# Patient Record
Sex: Male | Born: 2000 | Race: Black or African American | Hispanic: No | Marital: Single | Smoking: Current every day smoker
Health system: Southern US, Community
[De-identification: ages and names within clinical notes are randomized; demographics above are authoritative.]

---
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
## 2020-05-05 ENCOUNTER — Other Ambulatory Visit: Payer: Self-pay

## 2020-05-05 ENCOUNTER — Encounter (HOSPITAL_BASED_OUTPATIENT_CLINIC_OR_DEPARTMENT_OTHER): Payer: Self-pay | Admitting: *Deleted

## 2020-05-05 DIAGNOSIS — F17200 Nicotine dependence, unspecified, uncomplicated: Secondary | ICD-10-CM | POA: Insufficient documentation

## 2020-05-05 DIAGNOSIS — U071 COVID-19: Secondary | ICD-10-CM | POA: Diagnosis not present

## 2020-05-05 DIAGNOSIS — J069 Acute upper respiratory infection, unspecified: Secondary | ICD-10-CM | POA: Diagnosis present

## 2020-05-05 NOTE — ED Triage Notes (Signed)
C/o covid sx x 3 days 

## 2020-05-06 ENCOUNTER — Emergency Department (HOSPITAL_BASED_OUTPATIENT_CLINIC_OR_DEPARTMENT_OTHER)
Admission: EM | Admit: 2020-05-06 | Discharge: 2020-05-06 | Disposition: A | Discharge status: Home / Self Care | Payer: HRSA Program | Attending: Emergency Medicine | Admitting: Emergency Medicine

## 2020-05-06 DIAGNOSIS — J069 Acute upper respiratory infection, unspecified: Secondary | ICD-10-CM

## 2020-05-06 LAB — SARS CORONAVIRUS 2 (TAT 6-24 HRS): SARS Coronavirus 2: POSITIVE — AB

## 2020-05-06 NOTE — ED Provider Notes (Signed)
[facility] EMERGENCY DEPARTMENT Provider Note   CSN: [id number] Arrival date & time: 05/05/20  2120     History Chief Complaint  Patient presents with  . covid sx    Dalton Chandler is a 20 y.o. male.  The history is provided by the patient.  URI Presenting symptoms: congestion, cough, rhinorrhea and sore throat   Severity:  Mild Onset quality:  Gradual Duration:  2 days Timing:  Constant Progression:  Worsening Chronicity:  New Relieved by:  None tried Worsened by:  Nothing Ineffective treatments:  None tried Associated symptoms: myalgias and sinus pain        No past medical history on file.  There are no problems to display for this patient.   History reviewed. No pertinent surgical history.     No family history on file.  Social History   Tobacco Use  . Smoking status: Current Every Day Smoker  . Smokeless tobacco: Never Used  Substance Use Topics  . Alcohol use: No  . Drug use: Yes    Types: Marijuana    Home Medications Prior to Admission medications   Medication Sig Start Date End Date Taking? Authorizing Provider  ibuprofen (ADVIL,MOTRIN) 600 MG tablet Take 1 tablet (600 mg total) by mouth every 6 (six) hours as needed for pain. 09/18/12   Loren Racer, MD    Allergies    Patient has no known allergies.  Review of Systems   Review of Systems  HENT: Positive for congestion, rhinorrhea, sinus pain and sore throat.   Respiratory: Positive for cough.   Musculoskeletal: Positive for myalgias.  All other systems reviewed and are negative.   Physical Exam Updated Vital Signs BP 140/76 (BP Location: Right Arm)   Pulse 72   Resp 17   Ht 5\' 7"  (1.702 m)   Wt 90.7 kg   SpO2 99%   BMI 31.32 kg/m   Physical Exam Vitals and nursing note reviewed.  Constitutional:      Appearance: He is well-developed and well-nourished.  HENT:     Head: Normocephalic and atraumatic.     Nose: Nose normal. No congestion or rhinorrhea.      Mouth/Throat:     Mouth: Mucous membranes are moist.     Pharynx: Oropharynx is clear.  Eyes:     Pupils: Pupils are equal, round, and reactive to light.  Cardiovascular:     Rate and Rhythm: Normal rate.  Pulmonary:     Effort: Pulmonary effort is normal. No respiratory distress.  Abdominal:     General: Abdomen is flat. There is no distension.  Musculoskeletal:        General: Normal range of motion.     Cervical back: Normal range of motion.  Skin:    General: Skin is warm and dry.     Coloration: Skin is not jaundiced or pale.  Neurological:     General: No focal deficit present.     Mental Status: He is alert.     ED Results / Procedures / Treatments   Labs (all labs ordered are listed, but only abnormal results are displayed) Labs Reviewed  SARS CORONAVIRUS 2 (TAT 6-24 HRS)    EKG None  Radiology No results found.  Procedures Procedures (including critical care time)  Medications Ordered in ED Medications - No data to display  ED Course  I have reviewed the triage vital signs and the nursing notes.  Pertinent labs & imaging results that were available during my care of  the patient were reviewed by me and considered in my medical decision making (see chart for details).    MDM Rules/Calculators/A&P                          covid type symptoms. Will test for same. VS WNL, exam unremarkable, no indication for further workup in ED, will dc pending test results.   Final Clinical Impression(s) / ED Diagnoses Final diagnoses:  Upper respiratory tract infection, unspecified type    Rx / DC Orders ED Discharge Orders    None       Maebell Lyvers, Barbara Cower, MD 05/06/20 [phone]

## 2021-05-08 ENCOUNTER — Emergency Department (HOSPITAL_BASED_OUTPATIENT_CLINIC_OR_DEPARTMENT_OTHER)
Admission: EM | Admit: 2021-05-08 | Discharge: 2021-05-08 | Disposition: A | Discharge status: Home / Self Care | Payer: Self-pay | Attending: Emergency Medicine | Admitting: Emergency Medicine

## 2021-05-08 ENCOUNTER — Other Ambulatory Visit: Payer: Self-pay

## 2021-05-08 ENCOUNTER — Encounter (HOSPITAL_BASED_OUTPATIENT_CLINIC_OR_DEPARTMENT_OTHER): Payer: Self-pay | Admitting: Emergency Medicine

## 2021-05-08 DIAGNOSIS — J029 Acute pharyngitis, unspecified: Secondary | ICD-10-CM | POA: Insufficient documentation

## 2021-05-08 DIAGNOSIS — Z720 Tobacco use: Secondary | ICD-10-CM | POA: Insufficient documentation

## 2021-05-08 LAB — GROUP A STREP BY PCR: Group A Strep by PCR: NOT DETECTED

## 2021-05-08 NOTE — Discharge Instructions (Signed)
Please quit smoking cigarettes, will follow-up with your primary care doctor if needed.  A strep test today was negative.

## 2021-05-08 NOTE — ED Triage Notes (Signed)
Reports his brother was diagnosed with strep.  Knows they share cigarettes sometimes and wants to make sure he doesn't have it too.  Denies any complaints.

## 2021-05-08 NOTE — ED Notes (Signed)
Pt refused Covid/flu swab. Provider notified

## 2021-05-08 NOTE — ED Provider Notes (Signed)
[facility] EMERGENCY DEPARTMENT Provider Note   CSN: [id number] Arrival date & time: 05/08/21  1929     History  No chief complaint on file.   Dalton Chandler is a 21 y.o. male.  HPI  21 year old history with history of tobacco use disorder presents due to strep throat exposure.  He is asymptomatic, specifically denies any chest pain, shortness of breath, sore throat, fevers, cough, body aches.  Reports he shares cigarettes with his brother he has been having a sore throat, his brother tested negative for strep but the patient wants to be reassured.  He has not taken anything at home because he is symptom-free, no provoking features because he does not have any symptoms.  Home Medications Prior to Admission medications   Medication Sig Start Date End Date Taking? Authorizing Provider  ibuprofen (ADVIL,MOTRIN) 600 MG tablet Take 1 tablet (600 mg total) by mouth every 6 (six) hours as needed for pain. 09/18/12   Loren Racer, MD      Allergies    Patient has no known allergies.    Review of Systems   Review of Systems  Constitutional:  Negative for fever.  HENT:  Negative for sore throat.        Strep exposure   Physical Exam Updated Vital Signs BP (!) 144/120 (BP Location: Right Arm)    Pulse 72    Temp 98.5 F (36.9 C) (Oral)    Resp 18    Ht 5\' 7"  (1.702 m)    Wt 98.4 kg    SpO2 100%    BMI 33.99 kg/m  Physical Exam Vitals and nursing note reviewed. Exam conducted with a chaperone present.  Constitutional:      General: He is not in acute distress.    Appearance: Normal appearance.  HENT:     Head: Normocephalic and atraumatic.     Mouth/Throat:     Pharynx: No oropharyngeal exudate or posterior oropharyngeal erythema.  Eyes:     General: No scleral icterus.    Extraocular Movements: Extraocular movements intact.     Pupils: Pupils are equal, round, and reactive to light.  Skin:    Coloration: Skin is not jaundiced.  Neurological:     Mental Status:  He is alert. Mental status is at baseline.     Coordination: Coordination normal.    ED Results / Procedures / Treatments   Labs (all labs ordered are listed, but only abnormal results are displayed) Labs Reviewed  GROUP A STREP BY PCR    EKG None  Radiology No results found.  Procedures Procedures    Medications Ordered in ED Medications - No data to display  ED Course/ Medical Decision Making/ A&P Clinical Course as of 05/08/21 2039  2040 May 08, 2021  1949 Patient refused COVID/flu swab.  Strep collected. [HS]    Clinical Course User Index [HS] May 10, 2021, PA-C                           Medical Decision Making  Stable vitals, nontoxic-appearing.  This is a 21 year old without any pertinent medical history, advised on smoking cessation for 3 minutes.  Patient does not desire any medical help with this, not in a position where he desires to quit.  His lungs are clear to auscultation, his oropharynx is clear.  Strep test negative, patient declined on a flu/COVID test.  Discharged in stable condition.        Final Clinical  Impression(s) / ED Diagnoses Final diagnoses:  Sore throat    Rx / DC Orders ED Discharge Orders     None         Theron Arista, PA-C 05/08/21 2040    Virgina Norfolk, DO 05/08/21 2135

## 2021-08-08 ENCOUNTER — Other Ambulatory Visit: Payer: Self-pay

## 2021-08-08 ENCOUNTER — Emergency Department (HOSPITAL_BASED_OUTPATIENT_CLINIC_OR_DEPARTMENT_OTHER)
Admission: EM | Admit: 2021-08-08 | Discharge: 2021-08-08 | Disposition: A | Discharge status: Home / Self Care | Payer: Self-pay | Attending: Emergency Medicine | Admitting: Emergency Medicine

## 2021-08-08 ENCOUNTER — Other Ambulatory Visit (HOSPITAL_BASED_OUTPATIENT_CLINIC_OR_DEPARTMENT_OTHER): Payer: Self-pay

## 2021-08-08 ENCOUNTER — Encounter (HOSPITAL_BASED_OUTPATIENT_CLINIC_OR_DEPARTMENT_OTHER): Payer: Self-pay | Admitting: Emergency Medicine

## 2021-08-08 DIAGNOSIS — L249 Irritant contact dermatitis, unspecified cause: Secondary | ICD-10-CM | POA: Insufficient documentation

## 2021-08-08 MED ORDER — TRIAMCINOLONE ACETONIDE 0.1 % EX CREA
1.0000 "application " | TOPICAL_CREAM | Freq: Two times a day (BID) | CUTANEOUS | 0 refills | Status: DC
  Filled 2021-08-08: qty 30, 15d supply, fill #0

## 2021-08-08 MED ORDER — ALCLOMETASONE DIPROPIONATE 0.05 % EX CREA
TOPICAL_CREAM | Freq: Two times a day (BID) | CUTANEOUS | 0 refills | Status: DC
  Filled 2021-08-08: qty 30, fill #0

## 2021-08-08 NOTE — ED Triage Notes (Signed)
Pt arrives pov, steady gait c/o rash bilateral arms x 3 weeks. C/o itching and burning ?

## 2021-08-08 NOTE — Discharge Instructions (Addendum)
You came to the emergency department today to be evaluated for your rash.  Your physical exam is consistent with a contact dermatitis.  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  I have given you prescription for a hydrocortisone cream.  Please apply this twice daily.  Do not use it for more than 2 weeks.  If your rash does not improve please follow-up with a dermatologist listed below. ? ?Get help right away if: ?You have a very bad headache. ?You have neck pain. ?Your neck is stiff. ?You throw up (vomit). ?You feel very sleepy. ?You see red streaks coming from the area. ?Your bone or joint near the area hurts after the skin has healed. ?The area turns darker. ?You have trouble breathing. ? ?[facility]: ? ? ?[facility]  ?3.2 (9) ? Dermatologist  ?[address]  ?[phone]  ? ?Dr. Mertha Finders, MD  ?2.6 (18) ? Dermatologist  ?[address]  ?[phone] ? ?[facility]  ?3.5 (3) ? [facility]  ?[address] [place]  ?[phone]  ? ?[facility]  ?4.0 (4) ? Dermatologist  ?[address]  ?[phone] ? ?Janalyn Harder MD  ?3.0 (2) ? Dermatologist  ?[address]  ?[phone] ? ?Hoyle Sauer  ?2.7 (6) ? Dermatologist  ?[address] [place]  ?[phone] ? ?[place] Amy Y MD  ?2.0 (1) ? Dermatologist  ?[address] [place]  ?[phone] ? ?[facility]  ?5.0 (3) ? Doctor  ?[address]  ?[phone]  ? ? ? ?

## 2021-08-08 NOTE — ED Provider Notes (Addendum)
?[facility] EMERGENCY DEPARTMENT ?Provider Note ? ? ?CSN: [id number] ?Arrival date & time: 08/08/21  1117 ? ?  ? ?History ? ?Chief Complaint  ?Patient presents with  ? Rash  ? ? ?Dalton Chandler is a 21 y.o. male with no pertinent past medical history.  Presents to the emergency department with a chief complaint of rash to bilateral forearms.  States that rash has been present over the last 2 to 3 weeks.  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  Patient states that he does not wear gloves when washing dishes in his forearms come into contact with warm water and different soaps.  Patient endorses pruritus to bilateral forearms.  States that he does have some burning sensation when rash is exposed to water.  Patient denies rash anywhere else on his body. ? ?Patient denies any fever, chills, numbness, weakness, purulent discharge, pallor, wounds. ? ? ?Rash ?Associated symptoms: no fever   ? ?  ? ?Home Medications ?Prior to Admission medications   ?Medication Sig Start Date End Date Taking? Authorizing Provider  ?ibuprofen (ADVIL,MOTRIN) 600 MG tablet Take 1 tablet (600 mg total) by mouth every 6 (six) hours as needed for pain. 09/18/12   Loren Racer, MD  ?   ? ?Allergies    ?Patient has no known allergies.   ? ?Review of Systems   ?Review of Systems  ?Constitutional:  Negative for chills and fever.  ?Skin:  Positive for rash. Negative for color change, pallor and wound.  ?Neurological:  Negative for weakness and numbness.  ? ?Physical Exam ?Updated Vital Signs ?BP 136/71   Pulse 75   Temp 98.7 ?F (37.1 ?C) (Oral)   Resp 18   Ht 5\' 7"  (1.702 m)   Wt 98.4 kg   SpO2 100%   BMI 33.99 kg/m?  ?Physical Exam ?Vitals and nursing note reviewed.  ?Constitutional:   ?   General: He is not in acute distress. ?   Appearance: He is not ill-appearing, toxic-appearing or diaphoretic.  ?HENT:  ?   Head: Normocephalic.  ?   Comments: No facial swelling or angioedema.  Handles oral  secretions without difficulty. ?Eyes:  ?   General: No scleral icterus.    ?   Right eye: No discharge.     ?   Left eye: No discharge.  ?Cardiovascular:  ?   Rate and Rhythm: Normal rate.  ?Pulmonary:  ?   Effort: Pulmonary effort is normal.  ?   Comments: Speaks in full complete sentences without difficulty ?Skin: ?   General: Skin is warm and dry.  ?   Findings: Rash present. No petechiae. Rash is macular. Rash is not papular, purpuric, pustular, scaling, urticarial or vesicular.  ?   Comments: Macular blanching rash noted to bilateral forearms, excoriation noted surrounding rash.  Rash is nontender.  No rash noted to oral mucosa, palms, or soles of feet.  ?Neurological:  ?   General: No focal deficit present.  ?   Mental Status: He is alert.  ?   GCS: GCS eye subscore is 4. GCS verbal subscore is 5. GCS motor subscore is 6.  ?Psychiatric:     ?   Behavior: Behavior is cooperative.  ? ? ? ? ?ED Results / Procedures / Treatments   ?Labs ?(all labs ordered are listed, but only abnormal results are displayed) ?Labs Reviewed - No data to display ? ?EKG ?None ? ?Radiology ?No results found. ? ?Procedures ?Procedures  ? ? ?  Medications Ordered in ED ?Medications - No data to display ? ?ED Course/ Medical Decision Making/ A&P ?  ?                        ?Medical Decision Making ?Risk ?Prescription drug management. ? ? ?Alert 21 year old male in no acute distress, nontoxic-appearing.  Presents the emergency department with a chief complaint of rash.   ? ?Information obtained from patient.  Past medical records reviewed including previous provider notes and labs. ? ?Rash started after he began working as a Public affairs consultant.  Patient does not wear any gloves when washing dishes and was exposed to hot water and different cleaners.  Patient has tried over-the-counter cream with no relief of symptoms.  On physical exam rashes noted to bilateral forearms.  Patient denies rash elsewhere on his body.  Rash is blanchable and nontender.   No signs of infection.  Patient has not started any new medications recently.  There is no rash to oral mucosa, palms, or soles of feet; low suspicion for SJS at this time. ? ? Suspect contact dermatitis.  Patient advised to wear gloves when washing dishes.  Additionally will prescribe patient with hydrocortisone cream.  Patient to follow-up with dermatology if rash does not improve. ? ?Based on patient's chief complaint, I considered admission might be necessary, however after reassuring ED workup feel patient is reasonable for discharge.  Discussed results, findings, treatment and follow up. Patient advised of return precautions. Patient verbalized understanding and agreed with plan. ? ?Portions of this note were generated with Scientist, clinical (histocompatibility and immunogenetics). Dictation errors may occur despite best attempts at proofreading. ? ? ? ? ? ? ? ? ?Final Clinical Impression(s) / ED Diagnoses ?Final diagnoses:  ?Irritant contact dermatitis, unspecified trigger  ? ? ?Rx / DC Orders ?ED Discharge Orders   ? ?      Ordered  ?  alclomethasone (ACLOVATE) 0.05 % cream  2 times daily       ? 08/08/21 1159  ? ?  ?  ? ?  ? ? ?  ?Haskel Schroeder, PA-C ?08/08/21 1201 ? ?  ?Haskel Schroeder, PA-C ?08/08/21 1203 ? ?  ?Melene Plan, DO ?08/08/21 1442 ? ?

## 2021-08-11 DIAGNOSIS — R0602 Shortness of breath: Secondary | ICD-10-CM | POA: Diagnosis not present

## 2021-08-11 DIAGNOSIS — Z1339 Encounter for screening examination for other mental health and behavioral disorders: Secondary | ICD-10-CM | POA: Diagnosis not present

## 2021-08-11 DIAGNOSIS — Z0000 Encounter for general adult medical examination without abnormal findings: Secondary | ICD-10-CM | POA: Diagnosis not present

## 2022-12-07 ENCOUNTER — Other Ambulatory Visit (HOSPITAL_COMMUNITY)
Admission: RE | Admit: 2022-12-07 | Discharge: 2022-12-07 | Disposition: A | Discharge status: Home / Self Care | Payer: Medicaid Other | Source: Ambulatory Visit | Attending: Physician Assistant | Admitting: Physician Assistant

## 2022-12-07 ENCOUNTER — Ambulatory Visit (INDEPENDENT_AMBULATORY_CARE_PROVIDER_SITE_OTHER): Payer: Medicaid Other | Admitting: Physician Assistant

## 2022-12-07 ENCOUNTER — Encounter: Payer: Self-pay | Admitting: Physician Assistant

## 2022-12-07 ENCOUNTER — Ambulatory Visit: Payer: Medicaid Other | Admitting: Physician Assistant

## 2022-12-07 VITALS — BP 120/78 | HR 73 | Temp 98.5°F | Resp 20 | Ht 67.0 in | Wt 223.0 lb

## 2022-12-07 DIAGNOSIS — Z720 Tobacco use: Secondary | ICD-10-CM | POA: Insufficient documentation

## 2022-12-07 DIAGNOSIS — Z0000 Encounter for general adult medical examination without abnormal findings: Secondary | ICD-10-CM

## 2022-12-07 DIAGNOSIS — R030 Elevated blood-pressure reading, without diagnosis of hypertension: Secondary | ICD-10-CM | POA: Diagnosis not present

## 2022-12-07 DIAGNOSIS — Z113 Encounter for screening for infections with a predominantly sexual mode of transmission: Secondary | ICD-10-CM

## 2022-12-07 LAB — CBC WITH DIFFERENTIAL/PLATELET
Absolute Monocytes: 470 cells/uL (ref 200–950)
Basophils Absolute: 48 cells/uL (ref 0–200)
Basophils Relative: 1 %
Eosinophils Absolute: 82 cells/uL (ref 15–500)
Eosinophils Relative: 1.7 %
HCT: 44.7 % (ref 38.5–50.0)
Hemoglobin: 15.3 g/dL (ref 13.2–17.1)
Lymphs Abs: 2270 cells/uL (ref 850–3900)
MCH: 29.6 pg (ref 27.0–33.0)
MCHC: 34.2 g/dL (ref 32.0–36.0)
MCV: 86.5 fL (ref 80.0–100.0)
MPV: 9.9 fL (ref 7.5–12.5)
Monocytes Relative: 9.8 %
Neutro Abs: 1930 cells/uL (ref 1500–7800)
Neutrophils Relative %: 40.2 %
Platelets: 288 10*3/uL (ref 140–400)
RBC: 5.17 10*6/uL (ref 4.20–5.80)
RDW: 12.4 % (ref 11.0–15.0)
Total Lymphocyte: 47.3 %
WBC: 4.8 10*3/uL (ref 3.8–10.8)

## 2022-12-07 NOTE — Assessment & Plan Note (Signed)
Discussed the health risks associated with smoking. -Encouraged smoking cessation.

## 2022-12-07 NOTE — Progress Notes (Signed)
New patient visit   Patient: Dalton Chandler   DOB: 2001/03/24   22 y.o. Male  MRN: [id number] Visit Date: 12/07/2022  Today's healthcare provider: Alfredia Ferguson, PA-C   Chief Complaint  Patient presents with   STD Testing    New partner   Subjective    Dalton Chandler is a 22 y.o. male who presents today as a new patient to establish care.  HPI  Discussed the use of AI scribe software for clinical note transcription with the patient, who gave verbal consent to proceed.  History of Present Illness   The patient, with a family history of diabetes, presents for STI screening and a general check-up. He expresses concern about potential diabetes due to their family history, but reports feeling generally well and maintaining an active lifestyle. He has recently started a new relationship after ending a 10-year relationship.    The patient admits to smoking cigarettes, but reports a recent decrease in consumption to about two to three cigarettes a day. The patient also admits to occasional marijuana use.       History reviewed. No pertinent past medical history. History reviewed. No pertinent surgical history. Family Status  Relation Name Status   Mother  Alive   Father  Alive   Brother  Alive  No partnership data on file   Family History  Problem Relation Age of Onset   Diabetes Father    Diabetes Brother    Asthma Brother    Social History   Socioeconomic History   Marital status: Single    Spouse name: Not on file   Number of children: Not on file   Years of education: Not on file   Highest education level: Not on file  Occupational History   Not on file  Tobacco Use   Smoking status: Every Day    Current packs/day: 0.25    Average packs/day: 0.3 packs/day for 7.6 years (1.9 ttl pk-yrs)    Types: Cigarettes    Start date: 2017   Smokeless tobacco: Never  Substance and Sexual Activity   Alcohol use: No   Drug use: Yes    Types: Marijuana   Sexual activity:  Yes    Birth control/protection: None  Other Topics Concern   Not on file  Social History Narrative   Not on file   Social Determinants of Health   Financial Resource Strain: Not on file  Food Insecurity: Not on file  Transportation Needs: Not on file  Physical Activity: Not on file  Stress: Not on file  Social Connections: Unknown (09/12/2021)   Received from [facility]   Social Network    Social Network: Not on file   Outpatient Medications Prior to Visit  Medication Sig   [DISCONTINUED] ibuprofen (ADVIL,MOTRIN) 600 MG tablet Take 1 tablet (600 mg total) by mouth every 6 (six) hours as needed for pain. (Patient not taking: Reported on 12/07/2022)   [DISCONTINUED] triamcinolone cream (KENALOG) 0.1 % Apply 1 application on to the skin 2 (two) times daily. (Patient not taking: Reported on 12/07/2022)   No facility-administered medications prior to visit.   No Known Allergies  Immunization History  Administered Date(s) Administered   Tdap 02/23/2018    Health Maintenance  Topic Date Due   HPV VACCINES (1 - Male 3-dose series) Never done   HIV Screening  Never done   Hepatitis C Screening  Never done   COVID-19 Vaccine (1 - 2023-24 season) Never done   INFLUENZA VACCINE  11/30/2022   DTaP/Tdap/Td (2 - Td or Tdap) 02/24/2028    Patient Care Team: Dalton Ferguson, PA-C as PCP - General (Physician Assistant)  Review of Systems  Constitutional:  Negative for fatigue and fever.  Respiratory:  Negative for cough and shortness of breath.   Cardiovascular:  Negative for chest pain, palpitations and leg swelling.  Genitourinary:  Positive for frequency.  Neurological:  Negative for dizziness and headaches.     Objective    BP (!) 142/83 (BP Location: Left Arm, Patient Position: Sitting)   Pulse 73   Temp 98.5 F (36.9 C) (Oral)   Resp 20   Ht 5\' 7"  (1.702 m)   Wt 223 lb (101.2 kg)   SpO2 99%   BMI 34.93 kg/m   Physical Exam Constitutional:      General: He is  awake.     Appearance: He is well-developed.  HENT:     Head: Normocephalic.     Right Ear: Tympanic membrane, ear canal and external ear normal.     Left Ear: Tympanic membrane, ear canal and external ear normal.     Nose: Nose normal. No congestion or rhinorrhea.     Mouth/Throat:     Mouth: Mucous membranes are moist.     Pharynx: No oropharyngeal exudate or posterior oropharyngeal erythema.  Eyes:     Pupils: Pupils are equal, round, and reactive to light.  Cardiovascular:     Rate and Rhythm: Normal rate and regular rhythm.     Heart sounds: Normal heart sounds.  Pulmonary:     Effort: Pulmonary effort is normal.     Breath sounds: Normal breath sounds.  Abdominal:     General: There is no distension.     Palpations: Abdomen is soft.     Tenderness: There is no abdominal tenderness. There is no guarding.  Musculoskeletal:     Cervical back: Normal range of motion.     Right lower leg: No edema.     Left lower leg: No edema.  Lymphadenopathy:     Cervical: No cervical adenopathy.  Skin:    General: Skin is warm.  Neurological:     Mental Status: He is alert and oriented to person, place, and time.  Psychiatric:        Attention and Perception: Attention normal.        Mood and Affect: Mood normal.        Speech: Speech normal.        Behavior: Behavior normal. Behavior is cooperative.     Depression Screen    12/07/2022    3:33 PM  PHQ 2/9 Scores  PHQ - 2 Score 0   No results found for any visits on 12/07/22.  Assessment & Plan      Problem List Items Addressed This Visit       Other   Tobacco use    Discussed the health risks associated with smoking. -Encouraged smoking cessation.      Elevated blood pressure reading    Initial blood pressure reading was high. Possible white coat hypertension. Will monitor.      Other Visit Diagnoses     Annual physical exam    -  Primary   Relevant Orders   CBC w/Diff   HgB A1c   Comp Met (CMET)   Lipid  panel   Routine screening for STI (sexually transmitted infection)       Relevant Orders   Urine cytology ancillary only   HIV  antibody (with reflex)   Hepatitis C antibody   RPR             Return in about 1 year (around 12/07/2023) for CPE.     I, Dalton Ferguson, PA-C have reviewed all documentation for this visit. The documentation on  12/07/22   for the exam, diagnosis, procedures, and orders are all accurate and complete.    Dalton Ferguson, PA-C  [facility] at [facility] [phone] (phone) [phone] (fax)  [facility]

## 2022-12-07 NOTE — Assessment & Plan Note (Addendum)
Initial blood pressure reading was high. Possible white coat hypertension. Will monitor.

## 2022-12-08 ENCOUNTER — Encounter: Payer: Self-pay | Admitting: Physician Assistant

## 2023-06-21 DIAGNOSIS — J069 Acute upper respiratory infection, unspecified: Secondary | ICD-10-CM | POA: Diagnosis not present

## 2023-06-21 DIAGNOSIS — B9789 Other viral agents as the cause of diseases classified elsewhere: Secondary | ICD-10-CM | POA: Diagnosis not present

## 2023-06-21 DIAGNOSIS — Z20822 Contact with and (suspected) exposure to covid-19: Secondary | ICD-10-CM | POA: Diagnosis not present

## 2023-06-25 ENCOUNTER — Ambulatory Visit: Payer: Self-pay | Admitting: Physician Assistant

## 2023-06-25 NOTE — Telephone Encounter (Signed)
 Chief Complaint: Flu-like symptoms  Symptoms: Congestion, chills, fever, sweats, not feeling well  Frequency: constant since last week  Pertinent Negatives: Patient denies vomiting, chest pain, SOB, vomiting Disposition: [] ED /[] Urgent Care (no appt availability in office) / [x] Appointment(In office/virtual)/ []  [facility]/ [] Home Care/ [] Refused Recommended Disposition /[] [facility]/ []  Follow-up with PCP Additional Notes: Patient states he has been feeling sick since Last week. His girlfriend was sick as well and he was helping take care of her and believes he caught whatever she had. Patient states he went to [facility] ED last week and tested negative for the Flu but his fever has returned and his symptoms continues without much relief. Care advice was given and patient has been scheduled to be seen by PCP on Wednesday. Patient asked if he can return to work. Advised he can return to work if he has not had a fever for 24 hours without taking fever reducing medication. Patient current temperature is 98 but he did take tylenol today.   Summary: Fever   Copied From CRM [id number]. Reason for Triage: patient was feeling out of breath [facility], patient girlfriend does have flu, temperature 100+, sweating was feeling a little nauseous, family history of diabetes and high blood pressure.     Reason for Disposition  [1] Fever returns after gone for over 24 hours AND [2] symptoms worse or not improved  Answer Assessment - Initial Assessment Questions 1. WORST SYMPTOM: "What is your worst symptom?" (e.g., cough, runny nose, muscle aches, headache, sore throat, fever)      Fever and chills  2. ONSET: "When did your flu symptoms start?"      Thursday  3. COUGH: "How bad is the cough?"       Mild cough 4. RESPIRATORY DISTRESS: "Describe your breathing."      Normal  5. FEVER: "Do you have a fever?" If Yes, ask: "What is your temperature, how was it  measured, and when did it start?"     Unsure today  6. EXPOSURE: "Were you exposed to someone with influenza?"       My girlfriend was sick Tuesday last week  7. FLU VACCINE: "Did you get a flu shot this year?"     No 8. HIGH RISK DISEASE: "Do you have any chronic medical problems?" (e.g., heart or lung disease, asthma, weak immune system, or other HIGH RISK conditions)     No  10. OTHER SYMPTOMS: "Do you have any other symptoms?"  (e.g., runny nose, muscle aches, headache, sore throat)       Dizzy, fever, nausea, chills, headache, congestion  Protocols used: Influenza (Flu) - [facility]

## 2023-06-27 ENCOUNTER — Encounter: Payer: Self-pay | Admitting: Physician Assistant

## 2023-06-27 ENCOUNTER — Other Ambulatory Visit (HOSPITAL_BASED_OUTPATIENT_CLINIC_OR_DEPARTMENT_OTHER): Payer: Self-pay

## 2023-06-27 ENCOUNTER — Ambulatory Visit: Payer: Medicaid Other | Admitting: Physician Assistant

## 2023-06-27 VITALS — BP 136/78 | HR 56 | Temp 98.1°F | Ht 67.0 in | Wt 225.2 lb

## 2023-06-27 DIAGNOSIS — J209 Acute bronchitis, unspecified: Secondary | ICD-10-CM

## 2023-06-27 MED ORDER — AZITHROMYCIN 250 MG PO TABS
ORAL_TABLET | ORAL | 0 refills | Status: AC
  Filled 2023-06-27: qty 6, 5d supply, fill #0

## 2023-06-27 NOTE — Progress Notes (Signed)
      Established patient visit   Patient: Dalton Chandler   DOB: [birth year]   23 y.o. Male  MRN: [id number] Visit Date: 06/27/2023  Today's healthcare provider: Alfredia Ferguson, PA-C   Chief Complaint  Patient presents with   Flu like symptoms    Started about last Tuesday, girlfriend was positive with flu. Patient states he feels like he has inflammation in chest Eyes are watery, runny and congested nose. States no headache or bodyaches lately.   OTC- mucinex   Subjective     Pt reports flu exposure, symptoms started last week, x 8 days ago. He reports chest congestion, watery eyes, runny nose. T max 101-102 F. Reports some SOB, wheezing. Denies headaches, bodyaches. Taking otc mucinex.  Pt was seen in ED 2/20 and tested negative flu, covid, rsv.  Medications: No outpatient medications prior to visit.   No facility-administered medications prior to visit.    Review of Systems  Constitutional:  Positive for fatigue and fever.  HENT:  Positive for congestion and sore throat.   Respiratory:  Positive for cough, shortness of breath and wheezing.   Cardiovascular:  Negative for chest pain, palpitations and leg swelling.  Neurological:  Positive for headaches. Negative for dizziness.       Objective    BP 136/78   Pulse (!) 56   Temp 98.1 F (36.7 C) (Oral)   Ht 5\' 7"  (1.702 m)   Wt 225 lb 4 oz (102.2 kg)   SpO2 98%   BMI 35.28 kg/m    Physical Exam Constitutional:      General: He is awake.     Appearance: He is well-developed.  HENT:     Head: Normocephalic.  Eyes:     Conjunctiva/sclera: Conjunctivae normal.  Cardiovascular:     Rate and Rhythm: Normal rate and regular rhythm.     Heart sounds: Normal heart sounds.  Pulmonary:     Effort: Pulmonary effort is normal.     Breath sounds: Wheezing present.     Comments: Crackles RUL Skin:    General: Skin is warm.  Neurological:     Mental Status: He is alert and oriented to person, place, and time.   Psychiatric:        Attention and Perception: Attention normal.        Mood and Affect: Mood normal.        Speech: Speech normal.        Behavior: Behavior is cooperative.      No results found for any visits on 06/27/23.  Assessment & Plan    Acute bronchitis, unspecified organism -     Azithromycin; Take 2 tablets on day 1, then 1 tablet daily on days 2 through 5  Dispense: 6 tablet; Refill: 0  Rx zpack, stressed hydration, tylenol, ibuprofen. Cont smoking cessation.  Return if symptoms worsen or fail to improve.       Alfredia Ferguson, PA-C  [facility] at [facility] [phone] (phone) [phone] (fax)  [facility]

## 2023-07-24 ENCOUNTER — Encounter: Payer: Self-pay | Admitting: Physician Assistant

## 2023-07-24 ENCOUNTER — Other Ambulatory Visit (HOSPITAL_BASED_OUTPATIENT_CLINIC_OR_DEPARTMENT_OTHER): Payer: Self-pay

## 2023-07-24 ENCOUNTER — Ambulatory Visit: Admitting: Physician Assistant

## 2023-07-24 VITALS — BP 117/80 | HR 71 | Ht 67.0 in | Wt 231.8 lb

## 2023-07-24 DIAGNOSIS — J301 Allergic rhinitis due to pollen: Secondary | ICD-10-CM | POA: Diagnosis not present

## 2023-07-24 MED ORDER — FLUTICASONE PROPIONATE 50 MCG/ACT NA SUSP
2.0000 | Freq: Every day | NASAL | 6 refills | Status: AC
  Filled 2023-07-24: qty 16, 30d supply, fill #0

## 2023-07-24 MED ORDER — AZELASTINE HCL 0.1 % NA SOLN
1.0000 | Freq: Two times a day (BID) | NASAL | 3 refills | Status: AC | PRN
  Filled 2023-07-24: qty 30, 30d supply, fill #0

## 2023-07-24 NOTE — Progress Notes (Unsigned)
      Established patient visit   Patient: Dalton Chandler   DOB: February 11, 2001   23 y.o. Male  MRN: [id number] Visit Date: 07/24/2023  Today's healthcare provider: Alfredia Ferguson, PA-C   No chief complaint on file.  Subjective     117/80  Medications: No outpatient medications prior to visit.   No facility-administered medications prior to visit.    Review of Systems {Insert previous labs (optional):23779} {See past labs  Heme  Chem  Endocrine  Serology  Results Review (optional):1}   Objective    There were no vitals taken for this visit. {Insert last BP/Wt (optional):23777}{See vitals history (optional):1}  Physical Exam Constitutional:      General: He is awake.     Appearance: He is well-developed.  HENT:     Head: Normocephalic.     Right Ear: Tympanic membrane normal.     Left Ear: Tympanic membrane normal.  Eyes:     Conjunctiva/sclera: Conjunctivae normal.  Cardiovascular:     Rate and Rhythm: Normal rate and regular rhythm.     Heart sounds: Normal heart sounds.  Pulmonary:     Effort: Pulmonary effort is normal.     Breath sounds: Normal breath sounds.  Skin:    General: Skin is warm.  Neurological:     Mental Status: He is alert and oriented to person, place, and time.  Psychiatric:        Attention and Perception: Attention normal.        Mood and Affect: Mood normal.        Speech: Speech normal.        Behavior: Behavior is cooperative.     ***  No results found for any visits on 07/24/23.  Assessment & Plan    There are no diagnoses linked to this encounter.  ***  No follow-ups on file.       Alfredia Ferguson, PA-C  [facility] at [facility] [phone] (phone) [phone] (fax)  [facility]

## 2023-07-25 ENCOUNTER — Encounter: Payer: Self-pay | Admitting: Physician Assistant

## 2023-07-25 DIAGNOSIS — J301 Allergic rhinitis due to pollen: Secondary | ICD-10-CM | POA: Insufficient documentation

## 2023-07-25 NOTE — Assessment & Plan Note (Addendum)
 Inconsistent use of over-the-counter antihistamines like Benadryl and Claritin has not provided relief. Emphasized the importance of consistent daily antihistamine use, preferably claritin  Recommended fluticasone nasal spray for daily use to reduce inflammation and azelastine nasal spray for as-needed use during severe allergy days, with caution about potential epistaxis with daily use.   Discussed allergy shots as an option with an allergist

## 2023-08-06 NOTE — Addendum Note (Signed)
 Addended byAlfredia Ferguson on: 08/06/2023 03:04 PM   Modules accepted: Orders

## 2023-11-23 ENCOUNTER — Ambulatory Visit: Admitting: Internal Medicine

## 2023-11-28 ENCOUNTER — Encounter: Admitting: Physician Assistant

## 2023-12-01 DIAGNOSIS — R519 Headache, unspecified: Secondary | ICD-10-CM | POA: Diagnosis not present

## 2023-12-01 DIAGNOSIS — Z20822 Contact with and (suspected) exposure to covid-19: Secondary | ICD-10-CM | POA: Diagnosis not present

## 2023-12-01 DIAGNOSIS — R509 Fever, unspecified: Secondary | ICD-10-CM | POA: Diagnosis not present

## 2023-12-08 ENCOUNTER — Encounter: Payer: Self-pay | Admitting: Internal Medicine

## 2023-12-11 ENCOUNTER — Encounter: Admitting: Physician Assistant

## 2023-12-11 NOTE — Progress Notes (Deleted)
 Complete physical exam   Patient: Dalton Chandler   DOB: Feb 19, 2001   23 y.o. Male  MRN: [id number] Visit Date: 12/11/2023  Today's healthcare provider: Manuelita Flatness, PA-C   No chief complaint on file.  Subjective    Dalton Chandler is a 23 y.o. male who presents today for a complete physical exam.   ***  No past medical history on file. No past surgical history on file. Social History   Socioeconomic History   Marital status: Single    Spouse name: Not on file   Number of children: Not on file   Years of education: Not on file   Highest education level: Not on file  Occupational History   Not on file  Tobacco Use   Smoking status: Every Day    Current packs/day: 0.25    Average packs/day: 0.3 packs/day for 8.6 years (2.2 ttl pk-yrs)    Types: Cigarettes    Start date: 2017   Smokeless tobacco: Never  Substance and Sexual Activity   Alcohol  use: No   Drug use: Yes    Types: Marijuana   Sexual activity: Yes    Birth control/protection: None  Other Topics Concern   Not on file  Social History Narrative   Not on file   Social Drivers of Health   Financial Resource Strain: Not on file  Food Insecurity: Not on file  Transportation Needs: Not on file  Physical Activity: Not on file  Stress: Not on file  Social Connections: Unknown (09/12/2021)   [facility]   Social Network    Social Network: Not on file  Intimate Partner Violence: Unknown (08/04/2021)   Received from [facility]   HITS    Physically Hurt: Not on file    Insult or Talk Down To: Not on file    Threaten Physical Harm: Not on file    Scream or Curse: Not on file   Family Status  Relation Name Status   Mother  Alive   Father  Alive   Brother  Alive  No partnership data on file   Family History  Problem Relation Age of Onset   Diabetes Father    Diabetes Brother    Asthma Brother    No Known Allergies  Patient Care Team: Flatness Manuelita, PA-C as PCP - General  (Physician Assistant)   Medications: Outpatient Medications Prior to Visit  Medication Sig   azelastine  (ASTELIN ) 0.1 % nasal spray Place 1 spray into both nostrils 2 (two) times daily as needed for rhinitis. Use in each nostril as directed   fluticasone  (FLONASE ) 50 MCG/ACT nasal spray Place 2 sprays into both nostrils daily.   No facility-administered medications prior to visit.    Review of Systems {Insert previous labs (optional):23779} {See past labs  Heme  Chem  Endocrine  Serology  Results Review (optional):1}  Objective    There were no vitals taken for this visit. {Insert last BP/Wt (optional):23777}{See vitals history (optional):1}  Physical Exam  ***  Last depression screening scores    12/07/2022    3:33 PM  PHQ 2/9 Scores  PHQ - 2 Score 0   Last fall risk screening     No data to display         Last Audit-C alcohol  use screening     No data to display         A score of 3 or more in women, and 4 or more in men indicates increased risk for  alcohol  abuse, EXCEPT if all of the points are from question 1   No results found for any visits on 12/11/23.  Assessment & Plan    Routine Health Maintenance and Physical Exam  Exercise Activities and Dietary recommendations  Goals   None    --balanced diet high in fiber and protein, low in sugars, carbs, fats. --physical activity/exercise 20-30 minutes 3-5 times a week    Immunization History  Administered Date(s) Administered   Tdap 02/23/2018    Health Maintenance  Topic Date Due   HPV VACCINES (1 - Male 3-dose series) Never done   Pneumococcal Vaccine: 19-49 Years (1 of 2 - PCV) Never done   Hepatitis B Vaccines (1 of 3 - 19+ 3-dose series) Never done   COVID-19 Vaccine (1 - 2024-25 season) Never done   INFLUENZA VACCINE  11/30/2023   DTaP/Tdap/Td (2 - Td or Tdap) 02/24/2028   Hepatitis C Screening  Completed   HIV Screening  Completed   Meningococcal B Vaccine  Aged Out    Discussed  health benefits of physical activity, and encouraged him to engage in regular exercise appropriate for his age and condition.  ***  No follow-ups on file.     Manuelita Flatness, PA-C  [facility] at [facility] [phone] (phone) [phone] (fax)  [facility]

## 2024-01-03 ENCOUNTER — Encounter

## 2024-02-11 ENCOUNTER — Other Ambulatory Visit (HOSPITAL_BASED_OUTPATIENT_CLINIC_OR_DEPARTMENT_OTHER): Payer: Self-pay

## 2024-02-11 ENCOUNTER — Encounter: Payer: Self-pay | Admitting: Internal Medicine

## 2024-02-11 ENCOUNTER — Ambulatory Visit: Payer: Self-pay

## 2024-02-11 ENCOUNTER — Ambulatory Visit (INDEPENDENT_AMBULATORY_CARE_PROVIDER_SITE_OTHER): Admitting: Internal Medicine

## 2024-02-11 VITALS — BP 116/68 | HR 68 | Temp 98.3°F | Resp 16 | Ht 67.0 in | Wt 225.5 lb

## 2024-02-11 DIAGNOSIS — L0291 Cutaneous abscess, unspecified: Secondary | ICD-10-CM | POA: Diagnosis not present

## 2024-02-11 MED ORDER — DOXYCYCLINE HYCLATE 100 MG PO TABS
100.0000 mg | ORAL_TABLET | Freq: Two times a day (BID) | ORAL | 0 refills | Status: AC
  Filled 2024-02-11: qty 14, 7d supply, fill #0

## 2024-02-11 NOTE — Telephone Encounter (Signed)
 Copied from CRM [id number]. Topic: Clinical - Red Word Triage >> Feb 11, 2024  8:38 AM Macario HERO wrote: Red Word that prompted transfer to Nurse Triage: Patient thinks he may have a spider bite. It's filled with pus, swollen and big. Reason for Disposition . [1] SEVERE bite pain (e.g., excruciating) AND [2] not improved after 2 hours of pain medicine  Answer Assessment - Initial Assessment Questions 1. TYPE of SPIDER: What type of spider was it?  (e.g., name, unknown, or brief description)     Patient is unsure but told at his job he was told it may be a recluse bite. Patient works in a [employer]. 2. LOCATION: Where is the bite located?      Front of left shin, a few other spots on right arm  3. PAIN: Is there any pain? If Yes, ask: How bad is it?  (Scale 1-10; or mild, moderate, severe)     Pain varies but states its worsened when there is not pressure on the leg. It is 7-8 4. SWELLING: How big is the swelling? (Inches, cm or compare to coins)      Swollen and inflamed 5. ONSET: When did the bite occur? (e.g., minutes, hours ago)      Over course of last week 6. TETANUS: When was your last tetanus booster?      2019 7. OTHER SYMPTOMS: Do you have any other symptoms?  (e.g., muscle cramps, abdomen pain, change in urine color)     Pus filled  Protocols used: Spider Bite - [employer]

## 2024-02-11 NOTE — Telephone Encounter (Signed)
 Appt scheduled

## 2024-02-11 NOTE — Telephone Encounter (Signed)
 FYI Only or Action Required?: FYI only for provider.  Patient was last seen in primary care on 07/24/2023 by Cyndi Shaver, PA-C.  Called Nurse Triage reporting Insect Bite.  Symptoms began several days ago.  Interventions attempted: Rest, hydration, or home remedies.  Symptoms are: gradually worsening.  Triage Disposition: See HCP Within 4 Hours (Or PCP Triage)  Patient/caregiver understands and will follow disposition?: Yes

## 2024-02-11 NOTE — Patient Instructions (Addendum)
 Start taking antibiotic called doxycycline  Keep the area clean with soap and water  You are sending a culture, we will let you know the results as soon as we can.  Depending on the results we may ask you to treat your skin with chlorhexidine  Call if the area get worse or you have fever or chills.

## 2024-02-11 NOTE — Progress Notes (Signed)
   Subjective:    Patient ID: Dalton Chandler Ill, male    DOB: 2000/05/13, 23 y.o.   MRN: [id number]  DOS:  02/11/2024 Acute  Discussed the use of AI scribe software for clinical note transcription with the patient, who gave verbal consent to proceed.  History of Present Illness Cutaneous lesions - Painful skin lesions present on the left pretibial area and right triceps. - Initial lesion on right triceps began two weeks ago as what patient thought it was a bite, which released a green substance when popped - Subsequently developed  whitehead-like spots in other places. - At this point he has 2 lesions at the left pretibial area for the last 4 to 5 days - Significant pain associated with the left pretibial lesion, especially when rubbed by his sock.  Constitutional symptoms - No fever or chills.     Review of Systems See above   No past medical history on file.  No past surgical history on file.  Current Outpatient Medications  Medication Instructions   azelastine  (ASTELIN ) 0.1 % nasal spray 1 spray, Each Nare, 2 times daily PRN, Use in each nostril as directed   fluticasone  (FLONASE ) 50 MCG/ACT nasal spray 2 sprays, Each Nare, Daily       Objective:   Physical Exam BP 116/68   Pulse 68   Temp 98.3 F (36.8 C) (Oral)   Resp 16   Ht 5' 7 (1.702 m)   Wt 225 lb 8 oz (102.3 kg)   SpO2 97%   BMI 35.32 kg/m  General:   Well developed, NAD, BMI noted. HEENT:  Normocephalic . Face symmetric, atraumatic Skin:  Right triceps: Area where he had infection seems healed Left leg: Distally has 2 spots, there 1 that is external since fluid blood.  See picture. Neurologic:  alert & oriented X3.  Speech normal, gait appropriate for age and unassisted Psych--  Cognition and judgment appear intact.  Cooperative with normal attention span and concentration.  Behavior appropriate. No anxious or depressed appearing.       Assessment    23 year old male, acute visit.     Assessment & Plan Cutaneous abscess of left lower leg Purulent abscess on left lower leg, likely bacterial.  Had at least 2 additional infections like this recently, raising suspicion for MRSA. - Send purulent material for culture. - Start doxycycline. - Advise based on culture results. - Consider MRSA protocol for decolonization if MRSA confirmed. -Will call if not better, see AVS. Procedure note: In a sterile fashion with the use of less than 1 mL of lidocaine  1% without and after the patient denied any allergies I lancet area obtaining about 1 mL of purulent material which was sent for culture.  The patient tolerated well and the area was covered with gauze

## 2024-02-14 LAB — WOUND CULTURE
MICRO NUMBER:: 17090995
SPECIMEN QUALITY:: ADEQUATE

## 2024-02-17 ENCOUNTER — Telehealth: Payer: Self-pay | Admitting: Internal Medicine

## 2024-02-17 ENCOUNTER — Ambulatory Visit: Payer: Self-pay | Admitting: Internal Medicine

## 2024-02-17 NOTE — Telephone Encounter (Signed)
 Advise patient: Culture showed a bacteria called MRSA. He needs to decolonize his skin --Chlorhexidine wash daily with 4% solution for 1 week.  Send a prescription --Apply chlorhexidine to wet skin from neck to the toes particularly skin folds, take a shower 1 minute later. --Send a prescription for mupirocin nasal ointment 2% apply to the nostril both sides twice daily for a week.

## 2024-02-18 ENCOUNTER — Other Ambulatory Visit (HOSPITAL_BASED_OUTPATIENT_CLINIC_OR_DEPARTMENT_OTHER): Payer: Self-pay

## 2024-02-18 MED ORDER — CHLORHEXIDINE GLUCONATE 4 % EX SOLN
CUTANEOUS | 0 refills | Status: AC
  Filled 2024-02-18: qty 472, 7d supply, fill #0

## 2024-02-18 MED ORDER — MUPIROCIN 2 % EX OINT
TOPICAL_OINTMENT | CUTANEOUS | 0 refills | Status: AC
  Filled 2024-02-18: qty 22, 7d supply, fill #0

## 2024-02-18 NOTE — Telephone Encounter (Signed)
 LMOM asking for call back. Okay for e2c2 to discuss. Prescriptions sent.

## 2024-02-19 ENCOUNTER — Other Ambulatory Visit (HOSPITAL_BASED_OUTPATIENT_CLINIC_OR_DEPARTMENT_OTHER): Payer: Self-pay

## 2024-02-19 NOTE — Telephone Encounter (Signed)
 Last read by Ole DELENA Ill at 2:09PM on 02/19/2024.

## 2024-02-29 ENCOUNTER — Other Ambulatory Visit (HOSPITAL_BASED_OUTPATIENT_CLINIC_OR_DEPARTMENT_OTHER): Payer: Self-pay

## 2024-04-23 ENCOUNTER — Ambulatory Visit

## 2024-05-21 ENCOUNTER — Encounter: Admitting: Physician Assistant

## 2024-05-22 ENCOUNTER — Other Ambulatory Visit: Payer: Self-pay | Admitting: Internal Medicine

## 2024-05-26 ENCOUNTER — Other Ambulatory Visit (HOSPITAL_BASED_OUTPATIENT_CLINIC_OR_DEPARTMENT_OTHER): Payer: Self-pay
